# Patient Record
Sex: Male | Born: 2000
Health system: Southern US, Community
[De-identification: ages and names within clinical notes are randomized; demographics above are authoritative.]

## PROBLEM LIST (undated history)

## (undated) DIAGNOSIS — W540XXA Bitten by dog, initial encounter: Secondary | ICD-10-CM

## (undated) HISTORY — PX: COSMETIC SURGERY: SHX468

---
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
## 2021-01-14 ENCOUNTER — Other Ambulatory Visit: Payer: Self-pay

## 2021-01-14 ENCOUNTER — Encounter: Payer: Self-pay | Admitting: Emergency Medicine

## 2021-01-14 ENCOUNTER — Ambulatory Visit
Admission: EM | Admit: 2021-01-14 | Discharge: 2021-01-14 | Disposition: A | Discharge status: Home / Self Care | Payer: No Typology Code available for payment source | Attending: Emergency Medicine | Admitting: Emergency Medicine

## 2021-01-14 DIAGNOSIS — R0602 Shortness of breath: Secondary | ICD-10-CM

## 2021-01-14 DIAGNOSIS — R1084 Generalized abdominal pain: Secondary | ICD-10-CM

## 2021-01-14 DIAGNOSIS — R059 Cough, unspecified: Secondary | ICD-10-CM | POA: Diagnosis not present

## 2021-01-14 DIAGNOSIS — R0789 Other chest pain: Secondary | ICD-10-CM | POA: Diagnosis not present

## 2021-01-14 MED ORDER — BENZONATATE 100 MG PO CAPS: 100.0000 mg | ORAL_CAPSULE | Freq: Three times a day (TID) | ORAL | 0 refills | Status: AC | PRN

## 2021-01-14 MED ORDER — ALBUTEROL SULFATE HFA 108 (90 BASE) MCG/ACT IN AERS
1.0000 | INHALATION_SPRAY | Freq: Four times a day (QID) | RESPIRATORY_TRACT | 0 refills | Status: AC | PRN
Start: 2021-01-14 — End: ?

## 2021-01-14 NOTE — Discharge Instructions (Signed)
Tessalon Perles prescribed for cough ProAir was prescribed for shortness of breath  use medications daily for symptom relief Use OTC medications like ibuprofen or tylenol as needed fever or pain Call or go to the ED if you have any new or worsening symptoms such as fever, worsening cough, shortness of breath, chest tightness, chest pain, turning blue, changes in mental status, et

## 2021-01-14 NOTE — ED Provider Notes (Addendum)
[facility]   [id number] 01/14/21 Arrival Time: 1049   Chief Complaint  Patient presents with   Abdominal Pain     SUBJECTIVE: History from: patient.  Sean Shepard is a 20 y.o. male who presents to the urgent care with a complaint of cough, shortness of breath, chest tightness for the past 2 weeks and generalized abdominal pain for the past 1 week.  Report chest tightness and abdominal pain has resolved.  Denies sick exposure to COVID, flu or strep.  Denies recent travel.  Has not use any OTC medication.  He has not been tested for Covid.  He is requesting a letter to return to work tomorrow.  Denies any aggravating factors.  Denies previous symptoms in the past.   Denies fever, chills, fatigue, sinus pain, rhinorrhea, sore throat, wheezing, chest pain, nausea, changes in bowel or bladder habits.     ROS: As per HPI.  All other pertinent ROS negative.     Past Medical History:  Diagnosis Date   Dog bite    Past Surgical History:  Procedure Laterality Date   COSMETIC SURGERY     No Known Allergies No current facility-administered medications on file prior to encounter.   Current Outpatient Medications on File Prior to Encounter  Medication Sig Dispense Refill   loratadine (CLARITIN) 10 MG tablet Take 1 tablet (10 mg total) by mouth daily. 30 tablet 5   ondansetron (ZOFRAN) 4 MG tablet Take 1 tablet (4 mg total) by mouth every 8 (eight) hours as needed for nausea or vomiting. 20 tablet 0   Social History   Socioeconomic History   Marital status: Single    Spouse name: Not on file   Number of children: Not on file   Years of education: Not on file   Highest education level: Not on file  Occupational History   Not on file  Tobacco Use   Smoking status: Passive Smoke Exposure - Never Smoker   Smokeless tobacco: Never Used  Vaping Use   Vaping Use: Every day  Substance and Sexual Activity   Alcohol use: No    Alcohol/week: 0.0 standard drinks    Drug use: No   Sexual activity: Not on file  Other Topics Concern   Not on file  Social History Narrative   Lives with parents, siblings          11th grade    Social Determinants of Health   Financial Resource Strain: Not on file  Food Insecurity: Not on file  Transportation Needs: Not on file  Physical Activity: Not on file  Stress: Not on file  Social Connections: Not on file  Intimate Partner Violence: Not on file   Family History  Problem Relation Age of Onset   Cancer Other        aunts on both sides   Diabetes Other    Arthritis Mother    Arthritis Father    Asthma Maternal Aunt    Asthma Paternal Uncle    Depression Paternal Uncle    Diabetes Paternal Uncle    Asthma Maternal Grandfather    Asthma Sister    Heart disease Neg Hx    Hypertension Neg Hx     OBJECTIVE:  Vitals:   01/14/21 1103 01/14/21 1104  BP:  127/67  Pulse:  (!) 103  Resp:  17  Temp:  98.7 F (37.1 C)  TempSrc:  Oral  SpO2:  99%  Weight: 121 lb (54.9 kg)   Height: 5\' 6"  (  1.676 m)      General appearance: alert; appears fatigued, but nontoxic; speaking in full sentences and tolerating own secretions HEENT: NCAT; Ears: EACs clear, TMs pearly gray; Eyes: PERRL.  EOM grossly intact. Sinuses: nontender; Nose: nares patent without rhinorrhea, Throat: oropharynx clear, tonsils non erythematous or enlarged, uvula midline  Neck: supple without LAD Lungs: unlabored respirations, symmetrical air entry; cough: mild; no respiratory distress; CTAB Heart: regular rate and rhythm.  Radial pulses 2+ symmetrical bilaterally Skin: warm and dry Psychological: alert and cooperative; normal mood and affect  LABS:  No results found for this or any previous visit (from the past 24 hour(s)).   ASSESSMENT & PLAN:  1. Cough   2. SOB (shortness of breath)   3. Generalized abdominal pain   4. Chest tightness     Meds ordered this encounter  Medications   benzonatate (TESSALON)  100 MG capsule    Sig: Take 1 capsule (100 mg total) by mouth 3 (three) times daily as needed for cough.    Dispense:  30 capsule    Refill:  0   albuterol (VENTOLIN HFA) 108 (90 Base) MCG/ACT inhaler    Sig: Inhale 1-2 puffs into the lungs every 6 (six) hours as needed for wheezing or shortness of breath.    Dispense:  18 g    Refill:  0    Discharge instructions  Tessalon Perles prescribed for cough ProAir was prescribed for shortness of breath  use medications daily for symptom relief Use OTC medications like ibuprofen or tylenol as needed fever or pain Call or go to the ED if you have any new or worsening symptoms such as fever, worsening cough, shortness of breath, chest tightness, chest pain, turning blue, changes in mental status, etc...   Reviewed expectations re: course of current medical issues. Questions answered. Outlined signs and symptoms indicating need for more acute intervention. Patient verbalized understanding. After Visit Summary given.         Durward Parcel, FNP 01/14/21 1208    Durward Parcel, FNP 01/14/21 1209

## 2021-01-14 NOTE — ED Triage Notes (Addendum)
Sharp pain in center of chest on and off x 2 weeks.  Pain to all over abdomen x 1week on and off.  Last bowel movement yesterday.  Denies any stomach or chest pain at this time.  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

## 2021-06-30 ENCOUNTER — Encounter: Payer: Self-pay | Admitting: Pediatrics
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
## 2024-03-14 ENCOUNTER — Ambulatory Visit (INDEPENDENT_AMBULATORY_CARE_PROVIDER_SITE_OTHER)

## 2024-03-14 ENCOUNTER — Ambulatory Visit (HOSPITAL_COMMUNITY)
Admission: EM | Admit: 2024-03-14 | Discharge: 2024-03-14 | Disposition: A | Discharge status: Home / Self Care | Attending: Family Medicine | Admitting: Family Medicine

## 2024-03-14 ENCOUNTER — Encounter (HOSPITAL_COMMUNITY): Payer: Self-pay

## 2024-03-14 DIAGNOSIS — M25472 Effusion, left ankle: Secondary | ICD-10-CM

## 2024-03-14 DIAGNOSIS — M25572 Pain in left ankle and joints of left foot: Secondary | ICD-10-CM

## 2024-03-14 NOTE — ED Triage Notes (Signed)
 Pt states moving his bed and the head board fell hitting back of rt calf/ankle/foot x5 days ago. States pain getting worse every day. States has took meloxicam, oxycodone, and ibuprofen with no relief.

## 2024-03-14 NOTE — Discharge Instructions (Signed)
Your x-rays of your ankle were negative for fracture or dislocation. You likely sprained your ankle.   Wear the ankle brace we provided in the clinic for the next couple of weeks to provide compression, stability, and comfort.  Please rest, ice, and elevate your ankle to help it heal and decrease inflammation.   Take 600mg ibuprofen and/or 1,000mg tylenol every 6 hours as needed for pain and inflammation. Take with food to avoid stomach upset.  Call the orthopedic provider listed on your discharge paperwork to schedule a follow-up appointment if your symptoms do not improve in the next 1-2 weeks with supportive care.  Return to urgent care if you experience worsening pain, numbness, tingling, change of color in your skin near the injury, or any other concerning symptoms.  I hope you feel better! 

## 2024-03-14 NOTE — ED Provider Notes (Signed)
 [facility]    CSN: [id number] Arrival date & time: 03/14/24  1307      History   Chief Complaint Chief Complaint  Patient presents with   Foot Injury    HPI Sean Shepard is a 23 y.o. male.   Sean Shepard is a 23 y.o. male presenting for chief complaint of Foot Injury that happened 5 days ago. Patient was attempting to move a headboard for a bed when the headboard accidentally tell striking his left calf and left lateral ankle. Reports bruising to the left calf and left lateral ankle as result of fall a few days ago, bruising has improved over the last 5 days. Currently complains of most severe pain to the lateral left ankle. Denies previous injury to the left ankle, numbness/tingling distally to injury. He does not take blood thinners and did not hit his head. Ambulating with limp favoring left ankle. Pain is severe with weightbearing activity. Taking meloxicam, ibuprofen, and oxycodone without difficulty.    Foot Injury   Past Medical History:  Diagnosis Date   Dog bite     Patient Active Problem List   Diagnosis Date Noted   Seasonal allergic rhinitis due to pollen 09/09/2018    Past Surgical History:  Procedure Laterality Date   COSMETIC SURGERY         Home Medications    Prior to Admission medications   Medication Sig Start Date End Date Taking? Authorizing Provider  albuterol (VENTOLIN HFA) 108 (90 Base) MCG/ACT inhaler Inhale 1-2 puffs into the lungs every 6 (six) hours as needed for wheezing or shortness of breath. 01/14/21   Avegno, Zachery Dakins, FNP  benzonatate (TESSALON) 100 MG capsule Take 1 capsule (100 mg total) by mouth 3 (three) times daily as needed for cough. 01/14/21   Avegno, Zachery Dakins, FNP  loratadine (CLARITIN) 10 MG tablet Take 1 tablet (10 mg total) by mouth daily. 09/12/19   Richrd Sox, MD  ondansetron (ZOFRAN) 4 MG tablet Take 1 tablet (4 mg total) by mouth every 8 (eight) hours as needed for nausea or vomiting. 04/14/20    Avegno, Zachery Dakins, FNP    Family History Family History  Problem Relation Age of Onset   Cancer Other        aunts on both sides   Diabetes Other    Arthritis Mother    Arthritis Father    Asthma Maternal Aunt    Asthma Paternal Uncle    Depression Paternal Uncle    Diabetes Paternal Uncle    Asthma Maternal Grandfather    Asthma Sister    Heart disease Neg Hx    Hypertension Neg Hx     Social History Social History   Tobacco Use   Smoking status: Passive Smoke Exposure - Never Smoker   Smokeless tobacco: Never  Vaping Use   Vaping status: Every Day  Substance Use Topics   Alcohol use: No    Alcohol/week: 0.0 standard drinks of alcohol   Drug use: No     Allergies   Patient has no known allergies.   Review of Systems Review of Systems Per HPI  Physical Exam Triage Vital Signs ED Triage Vitals [03/14/24 1437]  Encounter Vitals Group     BP 124/71     Systolic BP Percentile      Diastolic BP Percentile      Pulse Rate 70     Resp 18     Temp 98.3 F (36.8 C)  Temp src      SpO2 99 %     Weight      Height      Head Circumference      Peak Flow      Pain Score 4     Pain Loc      Pain Education      Exclude from Growth Chart    No data found.  Updated Vital Signs BP 124/71 (BP Location: Left Arm)   Pulse 70   Temp 98.3 F (36.8 C)   Resp 18   SpO2 99%   Visual Acuity Right Eye Distance:   Left Eye Distance:   Bilateral Distance:    Right Eye Near:   Left Eye Near:    Bilateral Near:     Physical Exam Vitals and nursing note reviewed.  Constitutional:      Appearance: He is not ill-appearing or toxic-appearing.  HENT:     Head: Normocephalic and atraumatic.     Right Ear: Hearing and external ear normal.     Left Ear: Hearing and external ear normal.     Nose: Nose normal.     Mouth/Throat:     Lips: Pink.  Eyes:     General: Lids are normal. Vision grossly intact. Gaze aligned appropriately.     Extraocular Movements:  Extraocular movements intact.     Conjunctiva/sclera: Conjunctivae normal.  Pulmonary:     Effort: Pulmonary effort is normal.  Musculoskeletal:     Cervical back: Neck supple.     Right ankle: Normal.     Left ankle: Swelling (lateral malleolus) present. No deformity, ecchymosis or lacerations. Tenderness present over the lateral malleolus. No medial malleolus tenderness. Decreased range of motion. Anterior drawer test negative. Normal pulse.     Left Achilles Tendon: Normal. No tenderness. Thompson's test negative.     Right foot: Normal.     Left foot: Normal.     Comments: Decreased ROM with dorsiflexion and plantarflexion of the left ankle. Sensation intact to bilateral lower extremities. Ambulatory with limp favoring left lower extremity due to pain.   Skin:    General: Skin is warm and dry.     Capillary Refill: Capillary refill takes less than 2 seconds.     Findings: No rash.  Neurological:     General: No focal deficit present.     Mental Status: He is alert and oriented to person, place, and time. Mental status is at baseline.     Cranial Nerves: No dysarthria or facial asymmetry.  Psychiatric:        Mood and Affect: Mood normal.        Speech: Speech normal.        Behavior: Behavior normal.        Thought Content: Thought content normal.        Judgment: Judgment normal.      UC Treatments / Results  Labs (all labs ordered are listed, but only abnormal results are displayed) Labs Reviewed - No data to display  EKG   Radiology No results found.  Procedures Procedures (including critical care time)  Medications Ordered in UC Medications - No data to display  Initial Impression / Assessment and Plan / UC Course  I have reviewed the triage vital signs and the nursing notes.  Pertinent labs & imaging results that were available during my care of the patient were reviewed by me and considered in my medical decision making (see chart for details).  1. Pain  and swelling of left ankle Imaging is negative for acute fracture or dislocation. We will manage this with conservative treatment as an acute sprain to the ankle.  RICE advised.   CAM walker boot applied in clinic and patient may use this as needed for compression and stability.  May use ibuprofen as needed for pain and swelling at home.  Walking referral to orthopedics given should symptoms fail to improve in the next few weeks with conservative care.   Counseled patient on potential for adverse effects with medications prescribed/recommended today, strict ER and return-to-clinic precautions discussed, patient verbalized understanding.    Final Clinical Impressions(s) / UC Diagnoses   Final diagnoses:  Pain and swelling of left ankle     Discharge Instructions      Your x-rays of your ankle were negative for fracture or dislocation. You likely sprained your ankle.   Wear the ankle brace we provided in the clinic for the next couple of weeks to provide compression, stability, and comfort.  Please rest, ice, and elevate your ankle to help it heal and decrease inflammation.   Take 600mg  ibuprofen and/or 1,000mg  tylenol every 6 hours as needed for pain and inflammation. Take with food to avoid stomach upset.  Call the orthopedic provider listed on your discharge paperwork to schedule a follow-up appointment if your symptoms do not improve in the next 1-2 weeks with supportive care.  Return to urgent care if you experience worsening pain, numbness, tingling, change of color in your skin near the injury, or any other concerning symptoms.  I hope you feel better!     ED Prescriptions   None    PDMP not reviewed this encounter.   Carlisle Beers, [place] 03/14/24 [phone]
